# Patient Record
Sex: Female | Born: 1935 | Hispanic: No | Smoking: Never smoker
Health system: Southern US, Community
[De-identification: ages and names within clinical notes are randomized; demographics above are authoritative.]

## PROBLEM LIST (undated history)

## (undated) DIAGNOSIS — M1990 Unspecified osteoarthritis, unspecified site: Secondary | ICD-10-CM

## (undated) DIAGNOSIS — K219 Gastro-esophageal reflux disease without esophagitis: Secondary | ICD-10-CM

## (undated) DIAGNOSIS — E785 Hyperlipidemia, unspecified: Secondary | ICD-10-CM

## (undated) DIAGNOSIS — D696 Thrombocytopenia, unspecified: Secondary | ICD-10-CM

## (undated) DIAGNOSIS — C801 Malignant (primary) neoplasm, unspecified: Secondary | ICD-10-CM

## (undated) DIAGNOSIS — E039 Hypothyroidism, unspecified: Secondary | ICD-10-CM

## (undated) DIAGNOSIS — Z923 Personal history of irradiation: Secondary | ICD-10-CM

## (undated) DIAGNOSIS — C73 Malignant neoplasm of thyroid gland: Secondary | ICD-10-CM

## (undated) HISTORY — PX: OTHER SURGICAL HISTORY: SHX169

---

## 2003-06-02 HISTORY — PX: COLON SURGERY: SHX602

## 2010-01-24 ENCOUNTER — Encounter (HOSPITAL_COMMUNITY)
Admission: RE | Admit: 2010-01-24 | Discharge: 2010-04-24 | Payer: Self-pay | Source: Home / Self Care | Admitting: Internal Medicine

## 2010-02-04 ENCOUNTER — Encounter: Payer: Self-pay | Admitting: Internal Medicine

## 2011-01-09 ENCOUNTER — Other Ambulatory Visit (HOSPITAL_COMMUNITY): Payer: Self-pay | Admitting: Internal Medicine

## 2011-01-09 DIAGNOSIS — C73 Malignant neoplasm of thyroid gland: Secondary | ICD-10-CM

## 2011-01-26 ENCOUNTER — Other Ambulatory Visit (HOSPITAL_COMMUNITY): Payer: Self-pay | Admitting: Internal Medicine

## 2011-01-26 ENCOUNTER — Encounter (HOSPITAL_COMMUNITY)
Admission: RE | Admit: 2011-01-26 | Discharge: 2011-01-26 | Disposition: A | Discharge status: Home / Self Care | Payer: Medicare Other | Source: Ambulatory Visit | Attending: Internal Medicine | Admitting: Internal Medicine

## 2011-01-26 DIAGNOSIS — C73 Malignant neoplasm of thyroid gland: Secondary | ICD-10-CM

## 2011-01-27 ENCOUNTER — Ambulatory Visit (HOSPITAL_COMMUNITY)
Admission: RE | Admit: 2011-01-27 | Discharge: 2011-01-27 | Disposition: A | Discharge status: Home / Self Care | Payer: Medicare Other | Source: Ambulatory Visit | Attending: Internal Medicine | Admitting: Internal Medicine

## 2011-01-28 ENCOUNTER — Encounter (HOSPITAL_COMMUNITY)
Admission: RE | Admit: 2011-01-28 | Discharge: 2011-01-28 | Disposition: A | Discharge status: Home / Self Care | Payer: Medicare Other | Source: Ambulatory Visit | Attending: Internal Medicine | Admitting: Internal Medicine

## 2011-01-30 ENCOUNTER — Encounter (HOSPITAL_COMMUNITY)
Admission: RE | Admit: 2011-01-30 | Discharge: 2011-01-30 | Disposition: A | Discharge status: Home / Self Care | Payer: Medicare Other | Source: Ambulatory Visit | Attending: Internal Medicine | Admitting: Internal Medicine

## 2011-01-30 DIAGNOSIS — C73 Malignant neoplasm of thyroid gland: Secondary | ICD-10-CM | POA: Insufficient documentation

## 2011-01-30 MED ORDER — SODIUM IODIDE I 131 CAPSULE
4.0000 | Freq: Once | INTRAVENOUS | Status: AC | PRN
  Administered 2011-01-30: 4 via ORAL

## 2014-06-15 ENCOUNTER — Other Ambulatory Visit: Payer: Self-pay | Admitting: Surgical Oncology

## 2014-06-15 DIAGNOSIS — R160 Hepatomegaly, not elsewhere classified: Secondary | ICD-10-CM

## 2014-06-21 ENCOUNTER — Ambulatory Visit
Admission: RE | Admit: 2014-06-21 | Discharge: 2014-06-21 | Disposition: A | Discharge status: Home / Self Care | Payer: Medicare Other | Source: Ambulatory Visit | Attending: Surgical Oncology | Admitting: Surgical Oncology

## 2014-06-21 DIAGNOSIS — R160 Hepatomegaly, not elsewhere classified: Secondary | ICD-10-CM | POA: Insufficient documentation

## 2014-06-21 HISTORY — DX: Hyperlipidemia, unspecified: E78.5

## 2014-06-21 HISTORY — DX: Unspecified osteoarthritis, unspecified site: M19.90

## 2014-06-21 HISTORY — DX: Malignant (primary) neoplasm, unspecified: C80.1

## 2014-06-21 HISTORY — DX: Thrombocytopenia, unspecified: D69.6

## 2014-06-21 HISTORY — PX: IR GENERIC HISTORICAL: IMG1180011

## 2014-06-21 HISTORY — DX: Malignant neoplasm of thyroid gland: C73

## 2014-06-21 HISTORY — DX: Personal history of irradiation: Z92.3

## 2014-06-21 HISTORY — DX: Gastro-esophageal reflux disease without esophagitis: K21.9

## 2014-06-21 HISTORY — DX: Hypothyroidism, unspecified: E03.9

## 2014-06-21 NOTE — Consult Note (Signed)
Chief Complaint: Chief Complaint  Patient presents with  . Advice Only    Cholangiocarcinoma of Liver     Referring Physician(s): Drs. Mark Arredondo ([facility]) and Jacqulyn Ducking ([facility])  History of Present Illness:  Denise Hoover is a 81 y.o. 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  The patient has since underwent a round of chemotherapy radiation with Xeloda in hopes of shrinking this solitary mass for potential surgical resection. Surveillance imaging, most recently performed 05/21/2014, demonstrates no progression but no definitive reduction in size of the solitary dominant mass encompassing nearly entirety of the lateral segment of the left lobe of the liver.  As such, Dr. Adair Laundry is concerned about obtaining an adequate surgical margin and as such, has referred the patient to interventional radiology for consideration of potential transcatheter treatment options.  The patient is accompanied by her daughter though serves as her own historian.  The patient has tolerated the chemoradiation well without significant adverse effect.  Her functional status remains excellent. She lives alone and is independent with all activities of daily living.    The patient remains asymptomatic regarding her biopsy-proven cholangiocarcinoma. She denies abdominal pain. No unintentional weight loss. No yellowing of the skin or eyes. No change in appetite or energy level. No diarrhea or constipation. No bloody or melanotic stools. No fever or chills.  The patient is interested in pursuing all available treatment options available.  Past Medical History  Diagnosis Date  . GERD (gastroesophageal reflux disease)   . Cancer     Cholangiocarcinoma of the Liver    . Thyroid cancer   . S/P radiation therapy 4-12 wks ago   .  Thrombocytopenia   . Hypothyroidism   . Hyperlipidemia   . Arthritis     Past Surgical History  Procedure Laterality Date  . Colon surgery  2005    Right colectomy for polyps  . Venous stripping      Allergies: Aleve; Ciprocinonide; Flagyl; and Tramadol  Medications: Prior to Admission medications   Medication Sig Start Date End Date Taking? Authorizing Provider  cholecalciferol (VITAMIN D) 1000 UNITS tablet Take 1,000 Units by mouth daily.   Yes Historical Provider, MD  conjugated estrogens (PREMARIN) vaginal cream Place 1 Applicatorful vaginally daily.   Yes Historical Provider, MD  levothyroxine (SYNTHROID, LEVOTHROID) 100 MCG tablet Take 100 mcg by mouth daily before breakfast. Take 1 table daily Monday-Saturday, Sunday off.   Yes Historical Provider, MD  Multiple Vitamin (MULTIVITAMIN) tablet Take 1 tablet by mouth daily. Centrum Silver 1 tablet daily.   Yes Historical Provider, MD  pyridOXINE (VITAMIN B-6) 100 MG tablet Take 100 mg by mouth daily.   Yes Historical Provider, MD  atorvastatin (LIPITOR) 20 MG tablet Take 20 mg by mouth daily.    Historical Provider, MD  estrogens, conjugated, (PREMARIN) 0.625 MG tablet Take 0.625 mg by mouth daily. Take daily for 21 days then do not take for 7 days.    Historical Provider, MD    No family history on file.  History   Social History  . Marital Status: Widowed    Spouse Name: N/A    Number of Children: N/A  . Years of Education: N/A   Social History Main Topics  . Smoking status: Never Smoker   . Smokeless tobacco: Never Used  . Alcohol Use: No  . Drug Use:  No  . Sexual Activity: Not on file   Other Topics Concern  . Not on file   Social History Narrative  . No narrative on file    ECOG Status: 0 - Asymptomatic  Review of Systems: A 12 point ROS discussed and pertinent positives are indicated in the HPI above.  All other systems are negative.  Review of Systems  Constitutional: Positive for unexpected weight  change. Negative for activity change and appetite change.       Pt admits to losing 20 lbs since 10/2013  HENT: Negative for hearing loss.        Bilateral hearing aides.  No yellow of the eyes.  Eyes: Negative.   Gastrointestinal: Negative.  Negative for nausea, abdominal pain, diarrhea, blood in stool and abdominal distention.  Genitourinary: Negative.   Musculoskeletal: Positive for joint swelling.       L elbow pain  Skin: Negative.  Negative for color change.  Neurological: Negative.   Hematological: Negative for adenopathy. Does not bruise/bleed easily.  Psychiatric/Behavioral: Negative.      Vital Signs: BP 141/65 mmHg  Pulse 71  Temp(Src) 97.6 F (36.4 C) (Oral)  Resp 14  Ht _0  (1.549 m)  Wt 122 lb (55.339 kg)  BMI 23.06 kg/m2  SpO2 100%  Physical Exam  Constitutional: She appears well-developed and well-nourished.  Easily palpable R CFA pulse.  I am unable to palpate either the R DP or PT.  Eyes: Conjunctivae are normal. No scleral icterus.  Cardiovascular: Normal rate, regular rhythm, normal heart sounds and intact distal pulses.   Pulmonary/Chest: Effort normal and breath sounds normal.  Abdominal: Soft. Bowel sounds are normal. She exhibits no distension and no mass. There is no tenderness. There is no guarding.  Skin: Skin is warm and dry.  No jaundice.  Psychiatric: She has a normal mood and affect. Her behavior is normal.    Imaging:  CT abdomen pelvis - 05/21/2014; 01/30/2014; abdominal MRI - 05/14/2014; 8/006/2015; ultrasound-guided liver lesion biopsy - 01/16/2014  Review of most recently performed CT of the abdomen and pelvis obtained 05/21/2014 demonstrates no significant change in the approximately 7.3 x 5.2 cm peripherally enhancing mass nearly replacing the entirety of the lateral segment of the left lobe of the liver. There is minimal regional intrahepatic Peridex dilatation. There is no evidence of metastatic disease. Note, this acquired CT includes  an arterial phase examination  Labs: (All laboratory values from 05/01/2014 unless otherwise specified)  CBC: WBC-2.6. Hemoglobin-11.1. Hematocrit-31.3. Platelet-38.  COAGS: No results for input(s): INR, APTT in the last 8760 hours.  BMP: Cr - <0.47  LIVER FUNCTION TESTS: Alk Phos: 67 ALT: 21  AST: 56 Bili (total): 1.5  TUMOR MARKERS: No results for input(s): AFPTM, CEA, CA199, CHROMGRNA in the last 8760 hours.  Assessment and Plan:  Denise Hoover is a 81 y.o. 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  The patient has since underwent a round of chemotherapy radiation with Xeloda in hopes of shrinking this solitary mass for potential surgical resection.  However surveillance imaging, demonstrates no progression but no definitive reduction in size of the solitary dominant mass encompassing nearly entirety of the lateral segment of the left lobe of the liver.  As such, Dr. Adair Laundry is concerned about obtaining an adequate surgical margin and as such, has referred the patient to interventional radiology for consideration of  potential transcatheter treatment options.   Given the large size of the solitary mass (approximate 7.3 cm in greatest diameter), this patient is not a candidate for percutaneous ablation techniques. As such, prolonged discussions were held with the patient regarding potential percutaneous transcatheter interventions, including chemoembolization, bland embolization and Y 90 radioembolization..   1. Chemotherapy embolization - as cholangiocarcinoma is a somewhat rare malignancy, there are no established regimens for the type and dose of chemotherapy to be utilized for chemotherapy embolization.  Brief literature search review demonstrates chemotherapy embolization being performed with a variety of  chemotherapy agents including but not limited to doxorubicin, oxaliplatin and gemcitabine, among others.  Given lack of defined practice, I am not convinced that chemoembolization with provide enough shrinkage of the tumor to make her a surgical candidate.   2. Bland embolization - given the hypovascularity of cholangiocarcinoma, I do not feel bland embolization would adequately treat this lesion. Additionally, given as cholangiocarcinoma is contiguous with the bile ducts, I am concerned for the potential of the patient developing a superimposed infection following embolization and subsequent tumor necrosis. Finally, the left portal vein appears somewhat attenuated and bland embolization can only be safely performed in the setting of a patent portal vein.  3. Y 90 radioembolization - given the above discussion, I focused the majority of the subsequent conversation on a Y 90 radioembolization.  Note, the patient was born in [place] and lived through the atomic bomb attack for which I am to assume she had at least some background radiation exposure, as would be supported by her history of thyroid cancer. Additionally, the patient has undergone a recent round of liver directed radiation.  While this is of concern, the planned Y-90 radioembolization will be focused only on the lateral segment of the left lobe of the liver (as opposed to multicentric disease) and I feel Y 90 radioembolization/radiation segmentectomy offer the patient the best chance of tumor shrinkage and ultimately the potential for resection.   Candid discussions were held both with the patient and the patient's daughter regarding the benefits and risks (including but not limited to nontarget embolization, bleeding, infection, contrast and radiation exposure) of Y 7 radioembolization. The patient wishes to pursue all treatment options and after reviewing all potential percutaneous interventions, the patient elected to pursue Y 90  radioembolization.  As such, we will begin the paperwork for insurance approval.   Both the Y-90 mapping and treatment procedures will be performed at [facility]. Both patent procedures will be performed at outpatient basis.  Thank you for this interesting consult.  I greatly enjoyed meeting Denise Hoover and look forward to participating in her care.  Ronny Bacon, MD   I spent a total of 45 minutes face to face in clinical consultation, greater than 50% of which was counseling/coordinating care for cholangiocarcinoma.  SignedSandi Mariscal 06/21/2014, 3:05 PM

## 2014-07-24 HISTORY — PX: OTHER SURGICAL HISTORY: SHX169

## 2014-07-26 ENCOUNTER — Other Ambulatory Visit (HOSPITAL_COMMUNITY): Payer: Self-pay | Admitting: Interventional Radiology

## 2014-07-26 ENCOUNTER — Other Ambulatory Visit: Payer: Self-pay | Admitting: Radiology

## 2014-07-26 DIAGNOSIS — C221 Intrahepatic bile duct carcinoma: Secondary | ICD-10-CM

## 2014-08-22 ENCOUNTER — Ambulatory Visit
Admission: RE | Admit: 2014-08-22 | Discharge: 2014-08-22 | Disposition: A | Discharge status: Home / Self Care | Payer: Medicare Other | Source: Ambulatory Visit | Attending: Interventional Radiology | Admitting: Interventional Radiology

## 2014-08-22 ENCOUNTER — Other Ambulatory Visit: Payer: Self-pay | Admitting: *Deleted

## 2014-08-22 DIAGNOSIS — C221 Intrahepatic bile duct carcinoma: Secondary | ICD-10-CM | POA: Insufficient documentation

## 2014-08-22 HISTORY — PX: IR GENERIC HISTORICAL: IMG1180011

## 2014-08-22 NOTE — Progress Notes (Signed)
Patient ID: Denise Hoover, female   DOB: 21-Jul-1933, 81 y.o.   MRN: [id number]    Chief Complaint: Chief Complaint  Patient presents with  . Follow-up    1 mo follow up Y-90 SIRT    Referring Physician(s): Huff ([facility]) Arredondo ([facility]) Kasibhatla ([facility])  History of Present Illness: Denise Hoover is a 81 y.o. 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 The patient underwent a round of chemoradiation with Xeloda in hopes of shrinking this solitary mass for potential surgical resection however surveillance imaging performed 05/21/2014 demonstrated no interval change in the size of the solitary dominant mass encompassing nearly entirety of the lateral segment of the left lobe of the liver.  The patient underwent a technically successful Y 90 radioembolization/segmentectomy of the lateral segment of left lobe of the liver on 07/24/2014. She returns today to the interventional radiology clinic today for postprocedural evaluation. She is accompanied by a friend though serves as her own historian.  The patient has recovered uneventfully from the procedure. She admits to transient midline abdominal pain which occurs at various times during the day lasting approximately 20-30 minutes. The patient states this mild mid upper abdominal pain is not associated with food intake and was present prior to the radioembolization. The patient remains otherwise asymptomatic regarding her biopsy-proven cholangiocarcinoma. She denies unintentional weight loss. No yellowing of the skin or eyes. No change in appetite or energy level. No diarrhea or constipation. No bloody or melanotic stools. No fever or chills.   Past Medical History  Diagnosis Date  . GERD  (gastroesophageal reflux disease)   . Cancer     Cholangiocarcinoma of the Liver    . Thyroid cancer   . S/P radiation therapy 4-12 wks ago   . Thrombocytopenia   . Hypothyroidism   . Hyperlipidemia   . Arthritis     Past Surgical History  Procedure Laterality Date  . Colon surgery  2005    Right colectomy for polyps  . Venous stripping    . Y-90 sirt Left 07/24/2014    Allergies: Aleve; Ciprocinonide; Flagyl; and Tramadol  Medications: Prior to Admission medications   Medication Sig Start Date End Date Taking? Authorizing Provider  cholecalciferol (VITAMIN D) 1000 UNITS tablet Take 1,000 Units by mouth daily.   Yes Historical Provider, MD  conjugated estrogens (PREMARIN) vaginal cream Place 1 Applicatorful vaginally daily.   Yes Historical Provider, MD  levothyroxine (SYNTHROID, LEVOTHROID) 100 MCG tablet Take 100 mcg by mouth daily before breakfast. Take 1 table daily Monday-Saturday, Sunday off.   Yes Historical Provider, MD  Multiple Vitamin (MULTIVITAMIN) tablet Take 1 tablet by mouth daily. Centrum Silver 1 tablet daily.   Yes Historical Provider, MD  pyridOXINE (VITAMIN B-6) 100 MG tablet Take 100 mg by mouth daily.   Yes Historical Provider, MD  sucralfate (CARAFATE) 1 G tablet Take 1 g by mouth 2 (two) times daily.   Yes Historical Provider, MD  atorvastatin (LIPITOR) 20 MG tablet Take 20 mg by mouth daily.    Historical Provider, MD  estrogens, conjugated, (PREMARIN) 0.625 MG tablet Take 0.625 mg by mouth daily. Take daily for 21 days then do not take for 7 days.    Historical Provider, MD     No family history on file.  History   Social History  .  Marital Status: Widowed    Spouse Name: N/A  . Number of Children: N/A  . Years of Education: N/A   Social History Main Topics  . Smoking status: Never Smoker   . Smokeless tobacco: Never Used  . Alcohol Use: No  . Drug Use: No  . Sexual Activity: Not on file   Other Topics Concern  . None   Social History  Narrative   ECOG Status: 0 - Asymptomatic  Review of Systems: A 12 point ROS discussed and pertinent positives are indicated in the HPI above.  All other systems are negative.  Review of Systems  Constitutional: Negative.  Negative for fever, activity change, fatigue and unexpected weight change.  Respiratory: Negative.   Cardiovascular: Negative.   Gastrointestinal: Positive for abdominal pain. Negative for nausea, vomiting, diarrhea, constipation, blood in stool and abdominal distention.       Transient mid line upper abdominal pain which is not associated with food intake and was present prior to the Y-90 embolization.  Genitourinary: Negative.   Psychiatric/Behavioral: Negative.     Vital Signs: BP 133/55 mmHg  Pulse 66  Temp(Src) 98.3 F (36.8 C) (Oral)  Resp 13  SpO2 100%  Physical Exam  Constitutional: She appears well-developed and well-nourished.  Cardiovascular: Normal rate and regular rhythm.   Easily palpable R CFA and DP pulses.  Pulmonary/Chest: Effort normal and breath sounds normal.  Abdominal: Soft. Bowel sounds are normal. She exhibits no distension and no mass. There is no tenderness.  Skin: Skin is warm and dry.  Psychiatric: She has a normal mood and affect. Her behavior is normal.  Vitals reviewed.  Imaging:  Pre-Y 90 radioembolization - 07/10/2014; Y 90 radioembolization - 07/24/2014; CT abdomen and pelvis - 05/21/2014  Labs:  CBC: No results for input(s): WBC, HGB, HCT, PLT in the last 8760 hours.  COAGS: No results for input(s): INR, APTT in the last 8760 hours.  BMP: No results for input(s): NA, K, CL, CO2, GLUCOSE, BUN, CALCIUM, CREATININE, GFRNONAA, GFRAA in the last 8760 hours.  Invalid input(s): CMP  LIVER FUNCTION TESTS: No results for input(s): BILITOT, AST, ALT, ALKPHOS, PROT, ALBUMIN in the last 8760 hours.  TUMOR MARKERS: No results for input(s): AFPTM, CEA, CA199, CHROMGRNA in the last 8760 hours.  Assessment and  Plan:  Denise Hoover is a 81 y.o. 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 The patient underwent a round of chemoradiation with Xeloda in hopes of shrinking this solitary mass for potential surgical resection however surveillance imaging performed 05/21/2014 demonstrated no interval change in the size of the solitary dominant mass encompassing nearly entirety of the lateral segment of the left lobe of the liver.  As such, the Dr. Adair Laundry referred the patient to interventional radiology clinic for consultation of Y 90 radioembolization in hopes of treating and shrinking the dominant mass as well as to allow for hypertrophy of the remainder of the liver to improve the patient's candidacy for potential hepatic resection.    The patient underwent a technically successful Y 90 radioembolization/segmentectomy of the lateral segment of left lobe of the liver on 07/24/2014.  The patient has recovered fully from the procedure (ECOG-0) and is currently without new postprocedural complaint.   Postprocedural imaging strategies were discussed with referring physician, Dr. Adair Laundry, and decision was made to obtain a PET scan approximate 2 months following the Y 90 radioembolization (  in early May). The main point of this PET scan is to evaluate for extrahepatic disease. Assuming no extrahepatic disease, Dr. Adair Laundry will obtain more detailed anatomic imaging of the liver (likely with a hepatic protocol abdominal CT) to better evaluate the Y 90 radioembolization treatment effect as well as to evaluate for potential hypertrophy within the remainder of the liver. At that time, Dr. Adair Laundry will decide the patient's ultimate surgical candidacy.  The patient was instructed to call the interventional radiology with any interval questions or concerns. The patient may  return to the interventional radiology clinic on an as needed basis once the ultimate plan of care is decided.  SignedSandi Mariscal 08/22/2014, 10:31 AM   I spent a total of 25 Minutes in face to face in clinical consultation, greater than 50% of which was counseling/coordinating care for cholangiocarcinoma, post Y 90 radioembolization.

## 2016-06-03 ENCOUNTER — Encounter: Payer: Self-pay | Admitting: Interventional Radiology

## 2020-07-30 DEATH — deceased
